# Patient Record
Sex: Female | Born: 2000 | Race: Black or African American | Hispanic: No | Marital: Single | Smoking: Never smoker
Health system: Southern US, Community
[De-identification: ages and names within clinical notes are randomized; demographics above are authoritative.]

## PROBLEM LIST (undated history)

## (undated) DIAGNOSIS — Z789 Other specified health status: Secondary | ICD-10-CM

## (undated) HISTORY — DX: Other specified health status: Z78.9

## (undated) HISTORY — PX: TONSILLECTOMY AND ADENOIDECTOMY: SUR1326

---
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
## 2018-04-26 ENCOUNTER — Ambulatory Visit (INDEPENDENT_AMBULATORY_CARE_PROVIDER_SITE_OTHER): Payer: 59

## 2018-04-26 VITALS — BP 111/65 | HR 91 | Ht 64.0 in | Wt 153.4 lb

## 2018-04-26 DIAGNOSIS — Z01419 Encounter for gynecological examination (general) (routine) without abnormal findings: Secondary | ICD-10-CM

## 2018-04-26 DIAGNOSIS — Z0000 Encounter for general adult medical examination without abnormal findings: Secondary | ICD-10-CM

## 2018-04-26 DIAGNOSIS — Z30017 Encounter for initial prescription of implantable subdermal contraceptive: Secondary | ICD-10-CM | POA: Diagnosis not present

## 2018-04-26 NOTE — Progress Notes (Signed)
Patient ID: Madeline Reed, female   DOB: Oct 06, 2000, 18 y.o.   MRN: [id number]   Nexplanon Insertion Procedure:  Madeline N Verdejo requests insertion of Nexplanon for contraception method.  She understands the risks associated with insertion including pain, bleeding, infection, and paresthesias of the arm.  Patient also understands that Nexplanon can cause change in bleeding.  However, patient accepts and understands all these risks and desires to proceed.  Nexplanon inserted as below.  Informed consent signed.   Appropriate time out taken.  Patient's non-dominant left arm was identified prepped and draped in the usual sterile fashion. The area was marked ~8 cm from epicondyle and 3cm posterior to the sulcus between the biceps and tricep muscles. The area was prepped with alcohol swab and then injected with 46mL of 1% lidocaine.  The area was then prepped with betadine x 2 and allowed 60 seconds before being wiped away with sterile gauze. Nexplanon was removed from packaging and device was confirmed within needle by provider visualization.   The device was then inserted per manufacturers instruction without complications.  The device was then palpated in the patient's arm by patient and provider. The insertion area was hemostatic with pressure and insertion site covered with sterile gauze and then wrapped with pressure dressing.  The patient tolerated the procedure well and was given post procedure instructions.  Patient also instructed to abstain from or utilize condoms for sexual activity for the next 7 days.   Cherre Robins MSN, CNM 04/26/2018

## 2018-04-26 NOTE — Patient Instructions (Signed)
Etonogestrel implant  What is this medicine?  ETONOGESTREL (et oh noe JES trel) is a contraceptive (birth control) device. It is used to prevent pregnancy. It can be used for up to 3 years.  This medicine may be used for other purposes; ask your health care provider or pharmacist if you have questions.  COMMON BRAND NAME(S): Implanon, Nexplanon  What should I tell my health care provider before I take this medicine?  They need to know if you have any of these conditions:  -abnormal vaginal bleeding  -blood vessel disease or blood clots  -breast, cervical, endometrial, ovarian, liver, or uterine cancer  -diabetes  -gallbladder disease  -heart disease or recent heart attack  -high blood pressure  -high cholesterol or triglycerides  -kidney disease  -liver disease  -migraine headaches  -seizures  -stroke  -tobacco smoker  -an unusual or allergic reaction to etonogestrel, anesthetics or antiseptics, other medicines, foods, dyes, or preservatives  -pregnant or trying to get pregnant  -breast-feeding  How should I use this medicine?  This device is inserted just under the skin on the inner side of your upper arm by a health care professional.  Talk to your pediatrician regarding the use of this medicine in children. Special care may be needed.  Overdosage: If you think you have taken too much of this medicine contact a poison control center or emergency room at once.  NOTE: This medicine is only for you. Do not share this medicine with others.  What if I miss a dose?  This does not apply.  What may interact with this medicine?  Do not take this medicine with any of the following medications:  -amprenavir  -fosamprenavir  This medicine may also interact with the following medications:  -acitretin  -aprepitant  -armodafinil  -bexarotene  -bosentan  -carbamazepine  -certain medicines for fungal infections like fluconazole, ketoconazole, itraconazole and voriconazole  -certain medicines to treat hepatitis, HIV or  AIDS  -cyclosporine  -felbamate  -griseofulvin  -lamotrigine  -modafinil  -oxcarbazepine  -phenobarbital  -phenytoin  -primidone  -rifabutin  -rifampin  -rifapentine  -St. John's wort  -topiramate  This list may not describe all possible interactions. Give your health care provider a list of all the medicines, herbs, non-prescription drugs, or dietary supplements you use. Also tell them if you smoke, drink alcohol, or use illegal drugs. Some items may interact with your medicine.  What should I watch for while using this medicine?  This product does not protect you against HIV infection (AIDS) or other sexually transmitted diseases.  You should be able to feel the implant by pressing your fingertips over the skin where it was inserted. Contact your doctor if you cannot feel the implant, and use a non-hormonal birth control method (such as condoms) until your doctor confirms that the implant is in place. Contact your doctor if you think that the implant may have broken or become bent while in your arm.  You will receive a user card from your health care provider after the implant is inserted. The card is a record of the location of the implant in your upper arm and when it should be removed. Keep this card with your health records.  What side effects may I notice from receiving this medicine?  Side effects that you should report to your doctor or health care professional as soon as possible:  -allergic reactions like skin rash, itching or hives, swelling of the face, lips, or tongue  -breast lumps, breast tissue   changes, or discharge  -breathing problems  -changes in emotions or moods  -if you feel that the implant may have broken or bent while in your arm  -high blood pressure  -pain, irritation, swelling, or bruising at the insertion site  -scar at site of insertion  -signs of infection at the insertion site such as fever, and skin redness, pain or discharge  -signs and symptoms of a blood clot such as breathing  problems; changes in vision; chest pain; severe, sudden headache; pain, swelling, warmth in the leg; trouble speaking; sudden numbness or weakness of the face, arm or leg  -signs and symptoms of liver injury like dark yellow or brown urine; general ill feeling or flu-like symptoms; light-colored stools; loss of appetite; nausea; right upper belly pain; unusually weak or tired; yellowing of the eyes or skin  -unusual vaginal bleeding, discharge  Side effects that usually do not require medical attention (report to your doctor or health care professional if they continue or are bothersome):  -acne  -breast pain or tenderness  -headache  -irregular menstrual bleeding  -nausea  This list may not describe all possible side effects. Call your doctor for medical advice about side effects. You may report side effects to FDA at 1-800-FDA-1088.  Where should I keep my medicine?  This drug is given in a hospital or clinic and will not be stored at home.  NOTE: This sheet is a summary. It may not cover all possible information. If you have questions about this medicine, talk to your doctor, pharmacist, or health care provider.   2019 Elsevier/Gold Standard (2017-01-12 14:11:42)

## 2018-04-26 NOTE — Progress Notes (Signed)
Pt does want Nexplanon

## 2018-04-26 NOTE — Progress Notes (Signed)
History:  Ms. Madeline Reed is a 18 y.o. female who presents to clinic today for well woman exam and contraception initiation. Patient denies vaginal concerns and reports her LMP was Feb 5th and was normal-light lasting 3 days.  Patient does not have a PCP, but reports overall good health.  She denies recent sexual activity in the last 2 weeks and declines STD testing.  Patient desires Nexplanon implant and has no questions or concerns regarding this method. Patient endorses safety at home.  The following portions of the patient's history were reviewed and updated as appropriate: allergies, current medications, family history, past medical history, social history, past surgical history and problem list.  Review of Systems:  Review of Systems  Gastrointestinal: Negative for constipation and diarrhea.  Genitourinary: Negative for dysuria.  Neurological: Negative for headaches.  Psychiatric/Behavioral: Negative for depression.  All other systems reviewed and are negative.     Objective:  Physical Exam BP 111/65   Pulse 91   Ht 5\' 4"  (1.626 m)   Wt 153 lb 6.4 oz (69.6 kg)   LMP 04/13/2018 (Exact Date)   BMI 26.33 kg/m  Physical Exam Constitutional:      Appearance: Normal appearance.  HENT:     Head: Normocephalic and atraumatic.     Mouth/Throat:     Mouth: Mucous membranes are moist.     Pharynx: Oropharynx is clear.  Eyes:     Pupils: Pupils are equal, round, and reactive to light.  Neck:     Musculoskeletal: Normal range of motion.  Cardiovascular:     Rate and Rhythm: Normal rate and regular rhythm.     Pulses: Normal pulses.     Heart sounds: Normal heart sounds.  Pulmonary:     Effort: Pulmonary effort is normal.     Breath sounds: Normal breath sounds.  Abdominal:     General: Abdomen is flat. Bowel sounds are normal.  Genitourinary:    Comments: Pelvic Exam Deferred Musculoskeletal: Normal range of motion.  Skin:    General: Skin is warm and dry.  Neurological:      Mental Status: She is alert and oriented to person, place, and time.  Psychiatric:        Mood and Affect: Mood normal.        Behavior: Behavior normal.        Thought Content: Thought content normal.      Labs and Imaging No results found for this or any previous visit (from the past 24 hour(s)).  No results found.   Assessment & Plan:  1. Encounter for initial prescription of Nexplanon -Reviewed risks and benefits of Nexplanon. -No questions or concerns. -Nexplanon inserted; See procedure note for further details.   2. Encounter for well woman exam without gynecological exam -Discussed obtaining PCP -Encouraged healthy sexual behaviors including condom usage. -Offered and declines STD Testing. -Discussed pelvic exam with pap smear when 18 years old. -Encouraged to RTC prn for issues or concerns.    Gerrit Heck, CNM 04/26/2018 1:48 PM

## 2018-04-27 ENCOUNTER — Encounter: Payer: Self-pay | Admitting: Obstetrics & Gynecology

## 2018-04-27 LAB — POCT PREGNANCY, URINE: Preg Test, Ur: NEGATIVE

## 2018-04-27 MED ORDER — ETONOGESTREL 68 MG ~~LOC~~ IMPL
68.0000 mg | DRUG_IMPLANT | Freq: Once | SUBCUTANEOUS | Status: AC
  Administered 2018-04-26: 68 mg via SUBCUTANEOUS

## 2018-04-27 NOTE — Addendum Note (Signed)
Addended by: Henrietta Dine on: 04/27/2018 09:56 AM   Modules accepted: Orders

## 2018-06-02 ENCOUNTER — Telehealth: Payer: Self-pay | Admitting: *Deleted

## 2018-06-02 NOTE — Telephone Encounter (Signed)
Robinette's Mom called and left a message she recently got nexplanon and had period start 2 weeks ago and still bleeding. Wants to know if this is normal.

## 2018-06-03 NOTE — Telephone Encounter (Signed)
Called pt and patient put her mom back on the phone, mom stated that she had been bleeding more like dark spotting and that it was tapering off but was keeping a log on it advised pt of the side effects of Nexplanon and to keep a log on how heavy her periods are and to call if they are constant and not letting up.

## 2018-07-11 ENCOUNTER — Telehealth (INDEPENDENT_AMBULATORY_CARE_PROVIDER_SITE_OTHER): Payer: 59 | Admitting: Family Medicine

## 2018-07-11 DIAGNOSIS — Z3046 Encounter for surveillance of implantable subdermal contraceptive: Secondary | ICD-10-CM

## 2018-07-11 NOTE — Telephone Encounter (Signed)
Returned pt's call regarding bleeding.  Pt stated she did not have a period for February and most of march but towards the end of march she started bleeding.  Pt states that she changes her pad 4x a day.  Pt gave mother her phone to join the conversation.  Advised pt that it takes time after she receives a new birth control for her hormones to become regulated and for her to have more regular bleeding.  Advised her that some people stop having periods on nexplanon, others have normal periods, and some have irregular bleeding like she is having throughout this last month.  Advised pt to wait another month or so to see if her body regulates itself and if not, then she could schedule an appointment to speak with a provider.  Advised pt that if she started passing clots golf ball size or larger, or was saturating a pad an hour for several hours, then she needed to contact the office.  Pt and mother verbalized understanding.  Pt's mother requested genetic testing for the pt because pt's mother reports that she (the mother) just tested positive for BRCA-2. Pt's mother request that pt be tested for BRCA.  Advised pt and her mother that I would have to review the case with a provider before an appointment could be made. They verbalized understanding.   Reviewed with Dr. Stinson.  Ok for pt to have [facility] drawn for BRCA testing. Will send message to front office.  

## 2018-07-11 NOTE — Telephone Encounter (Signed)
The patient stated she has been bleeding for a month due to received the nexplannon. She also stated she changes pad about 4x a day. Also has questions about genetic testing. Informed the patient I will send a message to the nurse.

## 2018-07-20 ENCOUNTER — Telehealth: Payer: Self-pay | Admitting: Obstetrics & Gynecology

## 2018-07-20 ENCOUNTER — Telehealth: Payer: Self-pay | Admitting: Lactation Services

## 2018-07-20 NOTE — Telephone Encounter (Signed)
Attempted to call patient about making a lab appointment. Left a VM message for her to call us.

## 2018-07-20 NOTE — Telephone Encounter (Signed)
Pt was returning call from front office. She requested that if they are unable to reach her by her phone # [phone] to please call her mother's phone @ [phone]

## 2018-07-25 ENCOUNTER — Telehealth: Payer: Self-pay

## 2018-07-25 ENCOUNTER — Other Ambulatory Visit: Payer: Self-pay

## 2018-07-25 ENCOUNTER — Telehealth: Payer: Self-pay | Admitting: Obstetrics & Gynecology

## 2018-07-25 DIAGNOSIS — Z1379 Encounter for other screening for genetic and chromosomal anomalies: Secondary | ICD-10-CM

## 2018-07-25 NOTE — Telephone Encounter (Signed)
Attempted to reach patient about coming in to get blood work. Was not able to reach her on the number in Epic. However, I was able to speak with her mother. She will be able to come in around 1:30 for this appointment.

## 2018-07-25 NOTE — Progress Notes (Unsigned)
Pt presents for cancer screening.  Mother tested positive for BRCA-2 gene. Mother's name is Kima Malenfant,  MRN: [id number]  [facility] # [id number]

## 2018-08-10 ENCOUNTER — Telehealth: Payer: Self-pay | Admitting: Emergency Medicine

## 2018-08-10 NOTE — Telephone Encounter (Signed)
Pt mother called and left a message on the nurse voicemail line stating the pt was having acne related to her birth control and she would like to have an antibiotic prescribed. Pt mother also states that they would like to know their test results.  Phone call was returned to pt and she gave permission for me to speak with her mother. Mother was informed that while acne can be a possible side of effect of the nexplanon, it is not a common side effect. Pt mother was encouraged to take daughter to see a dermatologist. Pt mother was also reassured that we would call her with the test results from lab draw on 5/19. No further questions or concerns.

## 2018-08-11 ENCOUNTER — Other Ambulatory Visit: Payer: Self-pay

## 2018-08-22 ENCOUNTER — Encounter: Payer: Self-pay | Admitting: *Deleted

## 2018-08-23 ENCOUNTER — Telehealth: Payer: Self-pay

## 2018-08-23 NOTE — Telephone Encounter (Signed)
Received lab report from [place] with positive BRCA.  Dr. Hulan Fray recommended that the pt receives genetic counseling through [facility] and then an appt with [facility] after the genetic counseling appt.  Notified pt's mother the results and that I would be getting in touch with her about genetic counseling appt once I able to set up with [facility] due them providing the service.  Pt's mother asked about her 20 year old daughter if she needs to be tested.  I explained that I would ask [facility] that question when I set up the counseling appt.  Pt's mother stated understanding.

## 2018-08-26 NOTE — Telephone Encounter (Signed)
LM on pt's VM stating that she would need to call [phone] to schedule her own genetic counseling appt with [facility].  I also stated that I am sending a MyChart message with more information if she could please respond or call the office.

## 2018-09-06 NOTE — Telephone Encounter (Signed)
Spoke with pt's mother who informed that she received my message.  She stated that they have a phone interview scheduled for 09/08/18 @ 0900.  I informed pt and pt's mother that Dr. Hulan Fray would like to have her schedule an appt after the consultation.  I also informed pt's mother that she could also get her other daughter tested as well for free but just testing her for the BRCA testing.  Pt's mother was excited and stated that she wanted her to get tested.  I informed pt's mother that I would request the front office to give her a call to schedule both daughters appt.  Pt's mother stated understanding with no further questions.

## 2018-10-12 ENCOUNTER — Ambulatory Visit (INDEPENDENT_AMBULATORY_CARE_PROVIDER_SITE_OTHER): Payer: Self-pay | Admitting: Obstetrics & Gynecology

## 2018-10-12 ENCOUNTER — Other Ambulatory Visit: Payer: Self-pay

## 2018-10-12 DIAGNOSIS — Z1509 Genetic susceptibility to other malignant neoplasm: Secondary | ICD-10-CM

## 2018-10-12 DIAGNOSIS — Z1501 Genetic susceptibility to malignant neoplasm of breast: Secondary | ICD-10-CM

## 2018-10-12 NOTE — Progress Notes (Signed)
   Subjective:    Patient ID: Madeline Reed, female    DOB: Jul 20, 2000, 18 y.o.   MRN: [id number]  HPI 18 yo single G0 here today to discuss [facility] results-  + BRCA2. She had this drawn because her mom tested +. She has not spoken with the genetic counselor from [facility] as of yet. 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 She is also using condoms.    Objective:   Physical Exam Breathing, conversing, and ambulating normally Well nourished, well hydrated Black female, no apparent distress Breast exam normal bilaterally, no adenopathy       Assessment & Plan:  BRCA 2+ per [facility]- I will give her the phone # for genetic counselor for [facility] Discussed risks of cancers and screening recommendations SBE taught and encouraged

## 2019-03-20 NOTE — Telephone Encounter (Signed)
Opened in error

## 2019-05-10 ENCOUNTER — Other Ambulatory Visit: Payer: Self-pay

## 2019-05-10 ENCOUNTER — Ambulatory Visit (INDEPENDENT_AMBULATORY_CARE_PROVIDER_SITE_OTHER): Payer: 59

## 2019-05-10 VITALS — BP 112/63 | HR 75 | Wt 148.0 lb

## 2019-05-10 DIAGNOSIS — N644 Mastodynia: Secondary | ICD-10-CM

## 2019-05-10 DIAGNOSIS — Z1509 Genetic susceptibility to other malignant neoplasm: Secondary | ICD-10-CM

## 2019-05-10 DIAGNOSIS — Z1239 Encounter for other screening for malignant neoplasm of breast: Secondary | ICD-10-CM

## 2019-05-10 DIAGNOSIS — Z1501 Genetic susceptibility to malignant neoplasm of breast: Secondary | ICD-10-CM | POA: Diagnosis not present

## 2019-05-10 HISTORY — DX: Mastodynia: N64.4

## 2019-05-10 NOTE — Patient Instructions (Addendum)
Breast Self-Awareness Breast self-awareness means being familiar with how your breasts look and feel. It involves checking your breasts regularly and reporting any changes to your health care provider. Practicing breast self-awareness is important. Sometimes changes may not be harmful (are benign), but sometimes a change in your breasts can be a sign of a serious medical problem. It is important to learn how to do this procedure correctly so that you can catch problems early, when treatment is more likely to be successful. All women should practice breast self-awareness, including women who have had breast implants. What you need:  A mirror.  A well-lit room. How to do a breast self-exam A breast self-exam is one way to learn what is normal for your breasts and whether your breasts are changing. To do a breast self-exam: Look for changes  1. Remove all the clothing above your waist. 2. Stand in front of a mirror in a room with good lighting. 3. Put your hands on your hips. 4. Push your hands firmly downward. 5. Compare your breasts in the mirror. Look for differences between them (asymmetry), such as: ? Differences in shape. ? Differences in size. ? Puckers, dips, and bumps in one breast and not the other. 6. Look at each breast for changes in the skin, such as: ? Redness. ? Scaly areas. 7. Look for changes in your nipples, such as: ? Discharge. ? Bleeding. ? Dimpling. ? Redness. ? A change in position. Feel for changes Carefully feel your breasts for lumps and changes. It is best to do this while lying on your back on the floor, and again while sitting or standing in the tub or shower with soapy water on your skin. Feel each breast in the following way: 1. Place the arm on the side of the breast you are examining above your head. 2. Feel your breast with the other hand. 3. Start in the nipple area and make -inch (2 cm) overlapping circles to feel your breast. Use the pads of your  three middle fingers to do this. Apply light pressure, then medium pressure, then firm pressure. The light pressure will allow you to feel the tissue closest to the skin. The medium pressure will allow you to feel the tissue that is a little deeper. The firm pressure will allow you to feel the tissue close to the ribs. 4. Continue the overlapping circles, moving downward over the breast until you feel your ribs below your breast. 5. Move one finger-width toward the center of the body. Continue to use the -inch (2 cm) overlapping circles to feel your breast as you move slowly up toward your collarbone. 6. Continue the up-and-down exam using all three pressures until you reach your armpit.  Write down what you find Writing down what you find can help you remember what to discuss with your health care provider. Write down:  What is normal for each breast.  Any changes that you find in each breast, including: ? The kind of changes you find. ? Any pain or tenderness. ? Size and location of any lumps.  Where you are in your menstrual cycle, if you are still menstruating. General tips and recommendations  Examine your breasts every month.  If you are breastfeeding, the best time to examine your breasts is after a feeding or after using a breast pump.  If you menstruate, the best time to examine your breasts is 5-7 days after your period. Breasts are generally lumpier during menstrual periods, and it may  be more difficult to notice changes.  With time and practice, you will become more familiar with the variations in your breasts and more comfortable with the exam. Contact a health care provider if you:  See a change in the shape or size of your breasts or nipples.  See a change in the skin of your breast or nipples, such as a reddened or scaly area.  Have unusual discharge from your nipples.  Find a lump or thick area that was not there before.  Have pain in your breasts.  Have any  concerns related to your breast health. Summary  Breast self-awareness includes looking for physical changes in your breasts, as well as feeling for any changes within your breasts.  Breast self-awareness should be performed in front of a mirror in a well-lit room.  You should examine your breasts every month. If you menstruate, the best time to examine your breasts is 5-7 days after your menstrual period.  Let your health care provider know of any changes you notice in your breasts, including changes in size, changes on the skin, pain or tenderness, or unusual fluid from your nipples. This information is not intended to replace advice given to you by your health care provider. Make sure you discuss any questions you have with your health care provider. Document Revised: 10/12/2017 Document Reviewed: 10/12/2017 Elsevier Patient Education  2020 [employer].  Breast Tenderness Breast tenderness is a common problem for women of all ages, but may also occur in men. Breast tenderness may range from mild discomfort to severe pain. In women, the pain usually comes and goes with the menstrual cycle, but it can also be constant. Breast tenderness has many possible causes, including hormone changes, infections, and taking certain medicines. You may have tests, such as a mammogram or an ultrasound, to check for any unusual findings. Having breast tenderness usually does not mean that you have breast cancer. Follow these instructions at home: Managing pain and discomfort   If directed, put ice to the painful area. To do this: ? Put ice in a plastic bag. ? Place a towel between your skin and the bag. ? Leave the ice on for 20 minutes, 2-3 times a day.  Wear a supportive bra, especially during exercise. You may also want to wear a supportive bra while sleeping if your breasts are very tender. Medicines  Take over-the-counter and prescription medicines only as told by your health care provider. If  the cause of your pain is infection, you may be prescribed an antibiotic medicine.  If you were prescribed an antibiotic, take it as told by your health care provider. Do not stop taking the antibiotic even if you start to feel better. Eating and drinking  Your health care provider may recommend that you lessen the amount of fat in your diet. You can do this by: ? Limiting fried foods. ? Cooking foods using methods such as baking, boiling, grilling, and broiling.  Decrease the amount of caffeine in your diet. Instead, drink more water and choose caffeine-free drinks. General instructions   Keep a log of the days and times when your breasts are most tender.  Ask your health care provider how to do breast exams at home. This will help you notice if you have an unusual growth or lump.  Keep all follow-up visits as told by your health care provider. This is important. Contact a health care provider if:  Any part of your breast is hard, red, and hot  to the touch. This may be a sign of infection.  You are a woman and: ? Not breastfeeding and you have fluid, especially blood or pus, coming out of your nipples. ? Have a new or painful lump in your breast that remains after your menstrual period ends.  You have a fever.  Your pain does not improve or it gets worse.  Your pain is interfering with your daily activities. Summary  Breast tenderness may range from mild discomfort to severe pain.  Breast tenderness has many possible causes, including hormone changes, infections, and taking certain medicines.  It can be treated with ice, wearing a supportive bra, and medicines.  Make changes to your diet if told to by your health care provider. This information is not intended to replace advice given to you by your health care provider. Make sure you discuss any questions you have with your health care provider. Document Revised: 07/18/2018 Document Reviewed: 07/18/2018 Elsevier Patient  Education  [place].

## 2019-05-10 NOTE — Progress Notes (Signed)
   GYNECOLOGY PROBLEM OFFICE VISIT NOTE  History:  Madeline Reed is a 18 y.o. G0P0 who presents today for left side breast lump. She states she noticed it about 2 weeks ago and states it is not painful, but "uncomfortable when I lay on my left side."  Patient reports pain is 4-5/10 and she has not tried any interventions to improve pain.  Patient states pain is constant. Patient denies nipple discharge.  She endorses breast exams twice a week.    Patient expresses concern with breast lump due to her mother's recent diagnosis, treatment, and double mastectomy as well as having the BRCA gene.  However, mother is alive and well.    She reports her period was Feb 14th and was normal.    No past medical history on file.  Past Surgical History:  Procedure Laterality Date  . TONSILLECTOMY AND ADENOIDECTOMY     at age 1    The following portions of the patient's history were reviewed and updated as appropriate: allergies, current medications, past family history, past medical history, past social history, past surgical history and problem list.   Health Maintenance:  No pap on file d/t age.   Review of Systems:   Review of Systems  Constitutional: Negative for chills and fever.  Respiratory: Negative for cough and shortness of breath.   Cardiovascular: Negative for chest pain.  Gastrointestinal: Negative for constipation, diarrhea, nausea and vomiting.  Genitourinary: Negative for dysuria.   Breast ROS: positive for - new or changing breast lumps    Objective:  Vitals: Wt 148 lb (67.1 kg)   BMI 25.40 kg/m   Physical Exam: Physical Exam Constitutional:      Appearance: Normal appearance.  HENT:     Head: Normocephalic and atraumatic.  Eyes:     Conjunctiva/sclera: Conjunctivae normal.  Cardiovascular:     Rate and Rhythm: Normal rate and regular rhythm.     Heart sounds: Normal heart sounds.  Pulmonary:     Effort: Pulmonary effort is normal.     Breath sounds: Normal  breath sounds.  Chest:     Breasts:        Right: Tenderness (At 9'0'clock) present. No nipple discharge or skin change.        Left: Tenderness (At 3'o'clock) present. No nipple discharge or skin change.     Comments: Breast with fibrocystic-like tissue bilaterally, particularly to outside upper quadrants were tenderness is located.  No distinctive lumps or masses palpated.  Musculoskeletal:        General: Normal range of motion.     Cervical back: Normal range of motion.  Neurological:     Mental Status: She is alert.  Psychiatric:        Mood and Affect: Mood normal.        Thought Content: Thought content normal.      Labs and Imaging: No results found.  Assessment & Plan:  18 year old Female CBE Breast Tenderness-Bilaterally BRCA 2 Positive  -Reviewed exam findings. -Patient informed that provider did not palpate any distinctive lumps or masses, but more fibrocystic tissue which is a normal finding. -Patient informed that US would be ordered for follow up in 1-2 weeks.  -Discussed that provider will follow up as appropriate. -Patient to follow up in 6 months for Annual visit or prn for issues.    Emly, Jessica, CNM 05/10/2019 1:54 PM  

## 2019-05-25 ENCOUNTER — Other Ambulatory Visit: Payer: Self-pay

## 2019-05-25 ENCOUNTER — Ambulatory Visit
Admission: RE | Admit: 2019-05-25 | Discharge: 2019-05-25 | Disposition: A | Discharge status: Home / Self Care | Payer: 59 | Source: Ambulatory Visit

## 2019-05-25 DIAGNOSIS — Z1239 Encounter for other screening for malignant neoplasm of breast: Secondary | ICD-10-CM

## 2019-05-25 DIAGNOSIS — Z1501 Genetic susceptibility to malignant neoplasm of breast: Secondary | ICD-10-CM

## 2019-05-25 DIAGNOSIS — N644 Mastodynia: Secondary | ICD-10-CM

## 2019-05-25 DIAGNOSIS — Z1509 Genetic susceptibility to other malignant neoplasm: Secondary | ICD-10-CM

## 2020-05-29 ENCOUNTER — Ambulatory Visit (INDEPENDENT_AMBULATORY_CARE_PROVIDER_SITE_OTHER): Payer: 59 | Admitting: Certified Nurse Midwife

## 2020-05-29 ENCOUNTER — Encounter: Payer: Self-pay | Admitting: Certified Nurse Midwife

## 2020-05-29 ENCOUNTER — Other Ambulatory Visit (HOSPITAL_COMMUNITY)
Admission: RE | Admit: 2020-05-29 | Discharge: 2020-05-29 | Disposition: A | Discharge status: Home / Self Care | Payer: 59 | Source: Ambulatory Visit | Attending: Certified Nurse Midwife | Admitting: Certified Nurse Midwife

## 2020-05-29 ENCOUNTER — Other Ambulatory Visit: Payer: Self-pay

## 2020-05-29 VITALS — BP 107/75 | HR 80 | Wt 142.8 lb

## 2020-05-29 DIAGNOSIS — Z975 Presence of (intrauterine) contraceptive device: Secondary | ICD-10-CM

## 2020-05-29 DIAGNOSIS — N926 Irregular menstruation, unspecified: Secondary | ICD-10-CM

## 2020-05-29 DIAGNOSIS — Z113 Encounter for screening for infections with a predominantly sexual mode of transmission: Secondary | ICD-10-CM | POA: Insufficient documentation

## 2020-05-29 DIAGNOSIS — Z01419 Encounter for gynecological examination (general) (routine) without abnormal findings: Secondary | ICD-10-CM

## 2020-05-29 MED ORDER — NORGESTIMATE-ETH ESTRADIOL 0.25-35 MG-MCG PO TABS: 1.0000 | ORAL_TABLET | Freq: Every day | ORAL | 11 refills | Status: AC

## 2020-05-29 NOTE — Patient Instructions (Signed)
t

## 2020-05-29 NOTE — Progress Notes (Signed)
Patient is here for abnormal bleeding. She stated that she started her period on March 1st and while her cycles usually last for about 2-3 days this time she has been bleeding for weeks, which is unusual for her. She is still currently bleeding/spotting and also mentioned that she has a Nexplanon that was inserted back in 2020.   Zayan Delvecchio, CMA

## 2020-05-29 NOTE — Progress Notes (Signed)
GYNECOLOGY CLINIC ANNUAL PREVENTATIVE CARE ENCOUNTER NOTE  Subjective:   Madeline Reed is a 20 y.o. G0P0 female here for a routine annual gynecologic exam.  Current complaints: irregular uterine bleeding. She had a cycle for two months when she first had her Nexplanon implanted, then a regular cycle lasting 2-3 days. Her last cycle has lasted two weeks, with just spotting the last few days. Wants to know if this is abnormal or just a side effect of Nexplanon.  She has not been sexually active in "about a year". Denies abnormal discharge, pelvic pain, problems with intercourse or other gynecologic concerns.    Gynecologic History Patient's last menstrual period was 05/07/2020 (exact date). Contraception: Nexplanon Last Pap: N/A Last mammogram: N/A  Obstetric History OB History  No obstetric history on file.    Past Medical History:  Diagnosis Date  . Medical history non-contributory     Past Surgical History:  Procedure Laterality Date  . TONSILLECTOMY AND ADENOIDECTOMY     at age 28    Current Outpatient Medications on File Prior to Visit  Medication Sig Dispense Refill  . polyethylene glycol powder (GLYCOLAX/MIRALAX) powder Take 17 g by mouth daily. (Patient not taking: No sig reported) 500 g 1   No current facility-administered medications on file prior to visit.   No Known Allergies  Social History   Socioeconomic History  . Marital status: Single    Spouse name: Not on file  . Number of children: Not on file  . Years of education: Not on file  . Highest education level: Not on file  Occupational History  . Not on file  Tobacco Use  . Smoking status: Never Smoker  . Smokeless tobacco: Never Used  Vaping Use  . Vaping Use: Never used  Substance and Sexual Activity  . Alcohol use: Yes    Comment: occ  . Drug use: Never  . Sexual activity: Not Currently    Birth control/protection: Implant  Other Topics Concern  . Not on file  Social History Narrative  .  Not on file   Social Determinants of Health   Financial Resource Strain: Not on file  Food Insecurity: Not on file  Transportation Needs: Not on file  Physical Activity: Not on file  Stress: Not on file  Social Connections: Not on file  Intimate Partner Violence: Not on file    Family History  Problem Relation Age of Onset  . BRCA 1/2 Mother   . Breast cancer Mother 62       BRCA 2 +    The following portions of the patient's history were reviewed and updated as appropriate: allergies, current medications, past family history, past medical history, past social history, past surgical history and problem list.  Review of Systems Pertinent items noted in HPI and remainder of comprehensive ROS otherwise negative.   Objective:  BP 107/75   Pulse 80   Wt 142 lb 12.8 oz (64.8 kg)   LMP 05/07/2020 (Exact Date)   BMI 24.51 kg/m  CONSTITUTIONAL: Well-developed, well-nourished female in no acute distress.  HENT:  Normocephalic, atraumatic, External right and left ear normal. Oropharynx is clear and moist EYES: Conjunctivae and EOM are normal. Pupils are equal, round, and reactive to light. No scleral icterus.  NECK: Normal range of motion, supple, no masses.  Normal thyroid.  SKIN: Skin is warm and dry. No rash noted. Not diaphoretic. No erythema. No pallor. [place]: Alert and oriented to person, place, and time. Normal reflexes,  muscle tone coordination. No cranial nerve deficit noted. PSYCHIATRIC: Normal mood and affect. Normal behavior. Normal judgment and thought content. CARDIOVASCULAR: Normal heart rate noted, regular rhythm RESPIRATORY: Clear to auscultation bilaterally. Effort and breath sounds normal, no problems with respiration noted. BREASTS: Symmetric in size. No masses, skin changes, nipple drainage, or lymphadenopathy. ABDOMEN: Soft, normal bowel sounds, no distention noted.  No tenderness, rebound or guarding.  PELVIC: Normal appearing external genitalia; normal  appearing vaginal mucosa and cervix.  No abnormal discharge noted.  Pap smear obtained.  Normal uterine size, no other palpable masses, no uterine or adnexal tenderness. MUSCULOSKELETAL: Normal range of motion. No tenderness.  No cyanosis, clubbing, or edema.  2+ distal pulses.   Assessment & Plan:  Annual gynecologic examination with pap smear and STI testing - Will follow results of pap smear/STI testing and manage accordingly - Routine preventative health maintenance measures emphasized. Irregular uterine bleeding r/t Nexplanon - STI testing performed to check for infection - One month of COC prescribed, pt to follow up if bleeding does not resolve   Follow up PRN or in one year for annual gyn visit.  Gaylan Gerold, CNM, MSN, [place] Certified Nurse Midwife, [facility]

## 2020-05-30 LAB — CERVICOVAGINAL ANCILLARY ONLY
Bacterial Vaginitis (gardnerella): NEGATIVE
Candida Glabrata: NEGATIVE
Candida Vaginitis: NEGATIVE
Comment: NEGATIVE
Comment: NEGATIVE
Comment: NEGATIVE

## 2020-05-31 LAB — CYTOLOGY - PAP
Chlamydia: NEGATIVE
Comment: NEGATIVE
Comment: NEGATIVE
Comment: NORMAL
Diagnosis: NEGATIVE
Neisseria Gonorrhea: NEGATIVE
Trichomonas: NEGATIVE

## 2020-11-05 ENCOUNTER — Encounter: Payer: Self-pay | Admitting: Emergency Medicine

## 2020-11-05 ENCOUNTER — Other Ambulatory Visit: Payer: Self-pay

## 2020-11-05 ENCOUNTER — Ambulatory Visit
Admission: EM | Admit: 2020-11-05 | Discharge: 2020-11-05 | Disposition: A | Discharge status: Home / Self Care | Payer: 59 | Attending: Internal Medicine | Admitting: Internal Medicine

## 2020-11-05 DIAGNOSIS — B370 Candidal stomatitis: Secondary | ICD-10-CM

## 2020-11-05 DIAGNOSIS — J029 Acute pharyngitis, unspecified: Secondary | ICD-10-CM | POA: Diagnosis not present

## 2020-11-05 LAB — POCT RAPID STREP A (OFFICE): Rapid Strep A Screen: NEGATIVE

## 2020-11-05 MED ORDER — NYSTATIN 100000 UNIT/ML MT SUSP: 500000.0000 [IU] | Freq: Four times a day (QID) | OROMUCOSAL | 0 refills | Status: AC

## 2020-11-05 NOTE — ED Triage Notes (Signed)
Complains of sore throat for 2 days, woke this morning with white bumps on tongue per patient

## 2020-11-05 NOTE — ED Provider Notes (Signed)
[facility]    CSN: [id number] Arrival date & time: 11/05/20  1144      History   Chief Complaint Chief Complaint  Patient presents with   Sore Throat    HPI Madeline Reed is a 20 y.o. female who presents with mother due to pt has had a TS x 2 days, with low grade temp the first day, but that resolved and this am noticed white patches on her tongue. States her tongue feels like she got burned. Has mild rhinitis and cough. She denies use of inhaler and does not eat after anyone but mother. But mother is asymptomatic.     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      Home Medications    Prior to Admission medications   Medication Sig Start Date End Date Taking? Authorizing Provider  nystatin (MYCOSTATIN) 100000 UNIT/ML suspension Take 5 mLs (500,000 Units total) by mouth 4 (four) times daily for 7 days. To swish and gargle and spit 11/05/20 11/12/20 Yes Rodriguez-Southworth, Sunday Spillers, PA-C  norgestimate-ethinyl estradiol (ORTHO-CYCLEN) 0.25-35 MG-MCG tablet Take 1 tablet by mouth daily. 05/29/20   Gabriel Carina, CNM    Family History Family History  Problem Relation Age of Onset   BRCA 1/2 Mother    Breast cancer Mother 24       BRCA 2 +    Social History Social History   Tobacco Use   Smoking status: Never   Smokeless tobacco: Never  Vaping Use   Vaping Use: Never used  Substance Use Topics   Alcohol use: Yes    Comment: occ   Drug use: Never     Allergies   Patient has no known allergies.   Review of Systems Review of Systems  Constitutional:  Positive for  fever. Negative for appetite change, chills and fatigue.  HENT:  Positive for mouth sores, rhinorrhea and sore throat. Negative for congestion.   Respiratory:  Positive for cough.   Musculoskeletal:  Negative for myalgias.  Skin:  Negative for rash.  Neurological:  Negative for headaches.  Hematological:  Negative for adenopathy.    Physical Exam Triage Vital Signs ED Triage Vitals  Enc Vitals Group     BP 11/05/20 1210 103/73     Pulse Rate 11/05/20 1210 94     Resp 11/05/20 1210 18     Temp 11/05/20 1210 98.8 F (37.1 C)     Temp Source 11/05/20 1210 Oral     SpO2 11/05/20 1210 97 %     Weight --      Height --      Head Circumference --      Peak Flow --      Pain Score 11/05/20 1213 6     Pain Loc --      Pain Edu? --      Excl. in [place]? --    No data found.  Updated Vital Signs BP 103/73 (BP Location: Right Arm)   Pulse 94   Temp 98.8 F (37.1 C) (Oral)   Resp  18   SpO2 97%   Visual Acuity Right Eye Distance:   Left Eye Distance:   Bilateral Distance:    Right Eye Near:   Left Eye Near:    Bilateral Near:     Physical Exam Vitals and nursing note reviewed.  Constitutional:      General: She is not in acute distress.    Appearance: She is normal weight. She is not toxic-appearing.  HENT:     Right Ear: Tympanic membrane and ear canal normal.     Left Ear: Tympanic membrane and ear canal normal.     Mouth/Throat:     Mouth: Mucous membranes are moist.     Pharynx: Uvula midline. Posterior oropharyngeal erythema present.     Tonsils: 0 on the right. 0 on the left.     Comments: Has white patches on tongue Eyes:     Conjunctiva/sclera: Conjunctivae normal.  Cardiovascular:     Rate and Rhythm: Normal rate and regular rhythm.  Pulmonary:     Effort: Pulmonary effort is normal.     Breath sounds: Normal breath sounds.  Musculoskeletal:     Cervical back: Neck supple.  Lymphadenopathy:     Cervical: No cervical adenopathy.  Skin:    General: Skin  is warm and dry.     Findings: No rash.  Neurological:     Mental Status: She is alert and oriented to person, place, and time.  Psychiatric:        Mood and Affect: Mood normal.        Behavior: Behavior normal.     UC Treatments / Results  Labs (all labs ordered are listed, but only abnormal results are displayed) Labs Reviewed  POCT RAPID STREP A (OFFICE)  Rapid strep is neg.   EKG   Radiology No results found.  Procedures Procedures (including critical care time)  Medications Ordered in UC Medications - No data to display  Initial Impression / Assessment and Plan / UC Course  I have reviewed the triage vital signs and the nursing notes. Pertinent labs  results that were available during my care of the patient were reviewed by me and considered in my medical decision making (see chart for details). Pharyngitis and oral thrush I placed her on Nystatin oral swish as noted.   Final Clinical Impressions(s) / UC Diagnoses   Final diagnoses:  Acute pharyngitis, unspecified etiology  Oral candida   Discharge Instructions   None    ED Prescriptions     Medication Sig Dispense Auth. Provider   nystatin (MYCOSTATIN) 100000 UNIT/ML suspension Take 5 mLs (500,000 Units total) by mouth 4 (four) times daily for 7 days. To swish and gargle and spit 140 mL Rodriguez-Southworth, Sunday Spillers, PA-C      PDMP not reviewed this encounter.   Shelby Mattocks, PA-C 11/05/20 1257

## 2021-05-20 IMAGING — US US BREAST*R* LIMITED INC AXILLA
1 series · 2 of 2 positions shown · non-contrast
Comparison: None.

CLINICAL DATA: 18-year-old female presenting for evaluation of a
palpable lump in the lateral right breast, as well as bilateral
lateral breast tenderness for about 3 weeks. The patient has family
history of breast cancer in her mother recently diagnosed at age 47.
The patient and her mother have both tested positive for BRCA 2 gene
mutation.

EXAM:
ULTRASOUND OF THE BILATERAL BREASTS

[Series 1: us breast*right* limited inc axilla · 0.07mm/px · 2 of 2 slices shown]
[im 1/2]
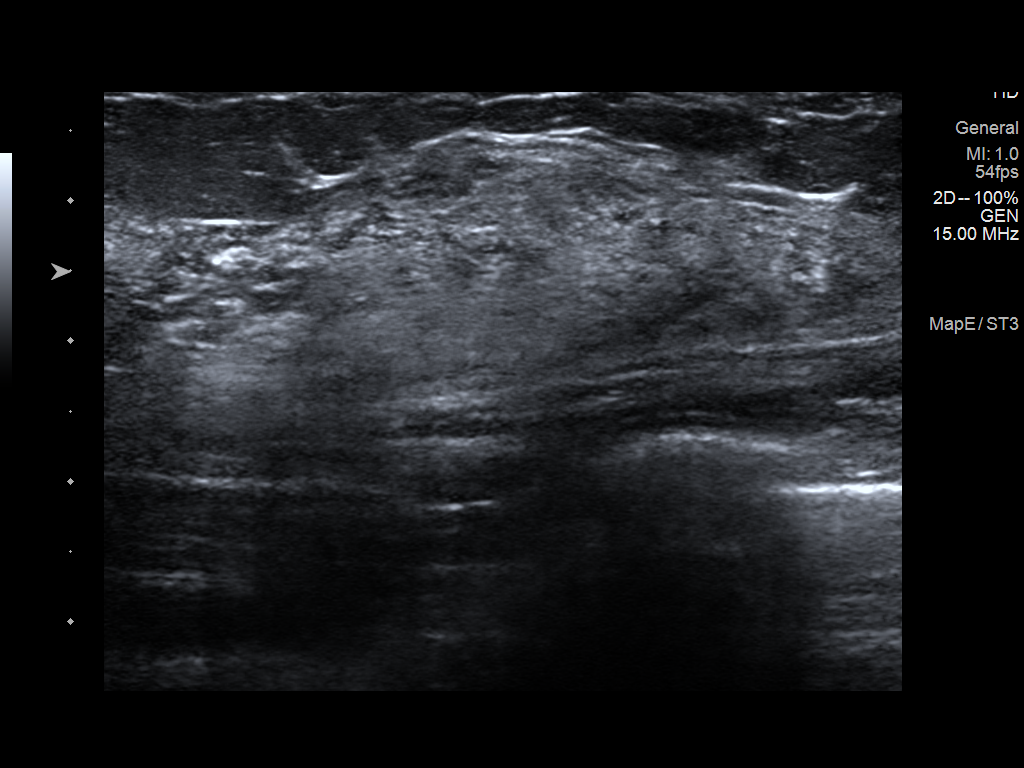
[im 2/2]
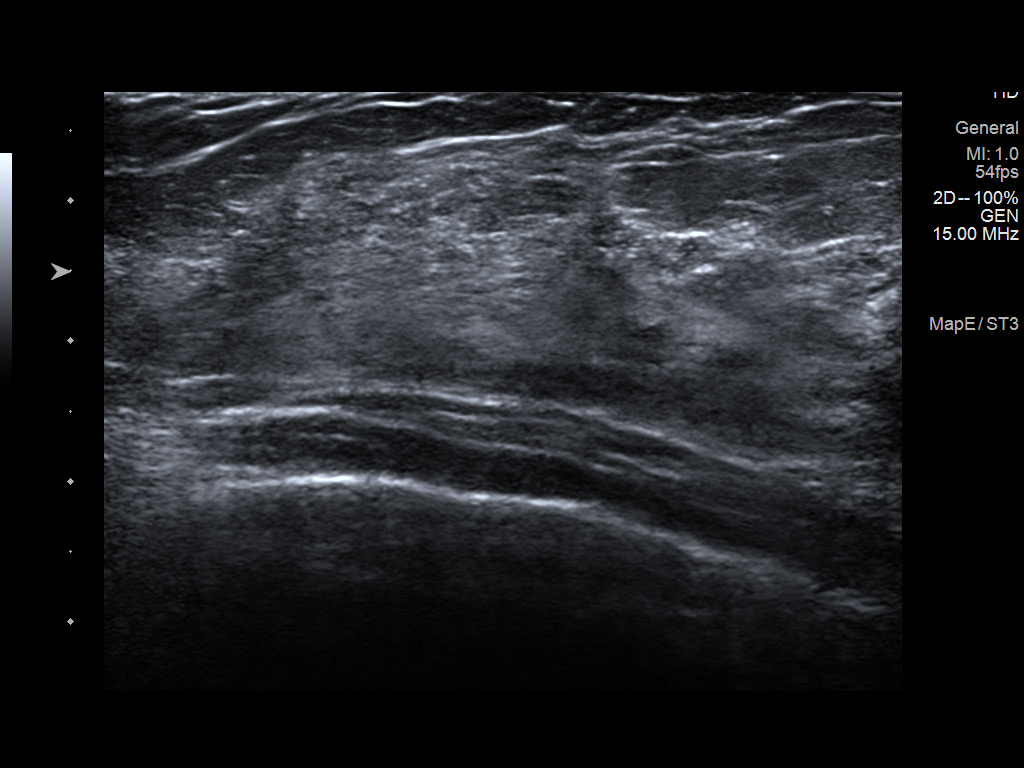

[2 of 2 positions shown; findings below may reference images not displayed]

FINDINGS: On physical exam, a firm ridge of tissue is palpated in the lateral
aspect of the right breast at the palpable site of concern. No
suspicious masses are palpated.

Targeted ultrasound is performed, showing normal fibroglandular
tissue in the region of pain in the right breast at 9 o'clock and in
the left breast at about 330. No suspicious masses or areas of
shadowing are identified.
IMPRESSION: 1. No targeted sonographic abnormalities in the bilateral breasts to
explain the patient's bilateral breast tenderness. No suspicious
findings in the right breast on targeted ultrasound to account for
the palpable site of concern.

RECOMMENDATION:
1. Clinical follow-up recommended for the palpable area of concern
in the lateral right breast and bilateral lateral breast pain. Any
further workup should be based on clinical grounds.

2. Given that the patient has tested positive for BRCA 2 gene
mutation, annual high risk screening MRI is recommended to start
between ages 25 and 30, in addition to annual mammography to start
at age 30.

I have discussed the findings and recommendations with the patient.
If applicable, a reminder letter will be sent to the patient
regarding the next appointment.

BI-RADS CATEGORY  1: Negative.

## 2021-05-21 ENCOUNTER — Encounter: Payer: Self-pay | Admitting: Family Medicine

## 2021-05-21 ENCOUNTER — Ambulatory Visit (INDEPENDENT_AMBULATORY_CARE_PROVIDER_SITE_OTHER): Payer: 59 | Admitting: Family Medicine

## 2021-05-21 ENCOUNTER — Other Ambulatory Visit: Payer: Self-pay

## 2021-05-21 VITALS — BP 121/87 | HR 97 | Wt 159.8 lb

## 2021-05-21 DIAGNOSIS — Z975 Presence of (intrauterine) contraceptive device: Secondary | ICD-10-CM

## 2021-05-21 DIAGNOSIS — Z30017 Encounter for initial prescription of implantable subdermal contraceptive: Secondary | ICD-10-CM

## 2021-05-21 DIAGNOSIS — Z3046 Encounter for surveillance of implantable subdermal contraceptive: Secondary | ICD-10-CM

## 2021-05-21 MED ORDER — ETONOGESTREL 68 MG ~~LOC~~ IMPL
68.0000 mg | DRUG_IMPLANT | Freq: Once | SUBCUTANEOUS | Status: AC
  Administered 2021-05-21: 68 mg via SUBCUTANEOUS

## 2021-05-21 NOTE — Progress Notes (Signed)
? ? ? ?  GYNECOLOGY OFFICE PROCEDURE NOTE ? ?[place] TIMA CURET is a 21 y.o. No obstetric history on file. here for Nexplanon removal and insertion.  Last pap smear was: ? ?Lab Results  ?Component Value Date  ? DIAGPAP  05/29/2020  ?  - Negative for intraepithelial lesion or malignancy (NILM)  ? ? ?Nexplanon removal and insertion Procedure ?Patient identified, informed consent performed, consent signed.   Patient does understand that irregular bleeding is a very common side effect of this medication. She was advised to have backup contraception for one week after replacement of the implant. Appropriate time out taken. Nexplanon site identified. Area prepped in usual sterile fashon. One ml of 1% lidocaine was used to anesthetize the area at the distal end of the implant in the left arm. A small stab incision was made right beside the implant on the distal portion. The Nexplanon rod was grasped using hemostats and removed without difficulty. There was minimal blood loss. There were no complications. Area was then injected with 3 ml of 1 % lidocaine. She was re-prepped with betadine, Nexplanon removed from packaging, Device confirmed in needle, then inserted full length of needle and withdrawn per handbook instructions. Nexplanon was able to palpated in the patient's arm; patient palpated the insert herself.  There was minimal blood loss. Patient insertion site covered with guaze and a pressure bandage to reduce any bruising. The patient tolerated the procedure well and was given post procedure instructions.  She was advised to have backup contraception for one week.   ? ?Venora Maples, MD/MPH ?Family Medicine, Faculty Practice ?[facility] [employer], [facility] ? ?

## 2021-05-21 NOTE — Progress Notes (Signed)
Patient would like her Nexplanon removed and reinserted today  ?

## 2021-05-22 ENCOUNTER — Ambulatory Visit: Payer: 59 | Admitting: Family Medicine

## 2022-10-04 ENCOUNTER — Ambulatory Visit (HOSPITAL_COMMUNITY): Payer: Self-pay

## 2022-10-05 ENCOUNTER — Ambulatory Visit (HOSPITAL_COMMUNITY): Payer: Self-pay

## 2022-10-06 ENCOUNTER — Ambulatory Visit: Payer: Self-pay

## 2023-05-23 ENCOUNTER — Ambulatory Visit
Admission: EM | Admit: 2023-05-23 | Discharge: 2023-05-23 | Disposition: A | Discharge status: Home / Self Care | Attending: Emergency Medicine | Admitting: Emergency Medicine

## 2023-05-23 DIAGNOSIS — B9689 Other specified bacterial agents as the cause of diseases classified elsewhere: Secondary | ICD-10-CM | POA: Diagnosis not present

## 2023-05-23 DIAGNOSIS — H109 Unspecified conjunctivitis: Secondary | ICD-10-CM | POA: Diagnosis not present

## 2023-05-23 MED ORDER — MOXIFLOXACIN HCL 0.5 % OP SOLN: 1.0000 [drp] | Freq: Three times a day (TID) | OPHTHALMIC | 0 refills | Status: AC

## 2023-05-23 NOTE — ED Provider Notes (Signed)
 [facility]    CSN: [id number] Arrival date & time: 05/23/23  1244      History   Chief Complaint Chief Complaint  Patient presents with   Conjunctivitis    HPI Madeline Reed is a 23 y.o. female.   Patient presents for evaluation of bilateral eye erythema, drainage with crusting and a burning sensation present for 2 days.  Denies injury or trauma, vision disturbance or light sensitivity.  Has not attempted treatment.  No known sick contacts.  Uses contacts but has not used recently.  Has eyelash extensions, last completed 3 weeks ago.  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      Home Medications    Prior to Admission medications   Medication Sig Start Date End Date Taking? Authorizing Provider  moxifloxacin (VIGAMOX) 0.5 % ophthalmic solution Place 1 drop into both eyes 3 (three) times daily. 05/23/23  Yes Yarixa Lightcap, Elita Boone, NP  norgestimate-ethinyl estradiol (ORTHO-CYCLEN) 0.25-35 MG-MCG tablet Take 1 tablet by mouth daily. Patient not taking: Reported on 05/21/2021 05/29/20   Bernerd Limbo, CNM    Family History Family History  Problem Relation Age of Onset   BRCA 1/2 Mother    Breast cancer Mother 6       BRCA 2 +    Social History Social History   Tobacco Use   Smoking status: Never   Smokeless tobacco: Never  Vaping Use   Vaping status: Never Used  Substance Use Topics   Alcohol use: Yes    Comment: occ   Drug use: Never     Allergies   Patient has no known allergies.   Review of Systems Review of Systems   Physical Exam Triage Vital Signs ED  Triage Vitals  Encounter Vitals Group     BP 05/23/23 1251 113/78     Systolic BP Percentile --      Diastolic BP Percentile --      Pulse Rate 05/23/23 1251 87     Resp 05/23/23 1251 15     Temp 05/23/23 1251 98.3 F (36.8 C)     Temp Source 05/23/23 1251 Oral     SpO2 05/23/23 1251 98 %     Weight --      Height --      Head Circumference --      Peak Flow --      Pain Score 05/23/23 1250 5     Pain Loc --      Pain Education --      Exclude from Growth Chart --    No data found.  Updated Vital Signs BP 113/78 (BP Location: Left Arm)   Pulse 87   Temp 98.3 F (36.8 C) (Oral)   Resp 15   SpO2 98%   Visual Acuity Right Eye Distance:   Left Eye Distance:   Bilateral Distance:    Right Eye Near:   Left Eye Near:    Bilateral Near:     Physical Exam Constitutional:      Appearance: Normal appearance.  Eyes:     Comments: Edema present to the bilateral conjunctiva, yellow drainage present to the bilateral upper lashes,, vision grossly intact, extraocular movements intact  Pulmonary:     Effort: Pulmonary effort is normal.  Neurological:     Mental Status: She is alert and oriented to person, place, and time.      UC Treatments / Results  Labs (all labs ordered are listed, but only abnormal results are displayed) Labs Reviewed - No data to display  EKG   Radiology No results found.  Procedures Procedures (including critical care time)  Medications Ordered in UC Medications - No data to display  Initial Impression / Assessment and Plan / UC Course  I have reviewed the triage vital signs and the nursing notes.  Pertinent labs & imaging results that were available during my care of the patient were reviewed by me and considered in my medical decision making (see chart for details).  \Bacterial conjunctivitis of both eyes  Presentation and symptomology consistent with above diagnosis, discussed with patient, prescribed moxifloxacin and recommended  additional supportive care, advised against use of contacts and that if reoccurrence of infection happens then recommended removal of lash extensions, may follow-up with urgent care as needed Final Clinical Impressions(s) / UC Diagnoses   Final diagnoses:  Bacterial conjunctivitis of both eyes     Discharge Instructions      Today you being treated for bacterial conjunctivitis.   Place one drop of moxifloxacin into the effected eye every 8 hours while awake for 7 days. If the other eye starts to have symptoms you may use medication in it as well. Do not allow tip of dropper to touch eye.  May use cool compress for comfort and to remove discharge if present. Pat the eye, do not wipe.  If wearing contacts, dispose of current pair. Wear glasses until symptoms have resolved.   Do not rub eyes, this may cause more irritation.  May use benadryl as needed to help if itching present.  Please avoid use of eye makeup until symptoms clear.  If symptoms persist after use of medication, please follow up at Urgent Care or with ophthalmologist (eye doctor)    ED Prescriptions     Medication Sig Dispense Auth. Provider   moxifloxacin (VIGAMOX) 0.5 % ophthalmic solution Place 1 drop into both eyes 3 (three) times daily. 3 mL Valinda Hoar, NP      PDMP not reviewed this encounter.   Valinda Hoar, NP 05/23/23 [phone]

## 2023-05-23 NOTE — Discharge Instructions (Signed)
 Today you being treated for bacterial conjunctivitis.   Place one drop of moxifloxacin into the effected eye every 8 hours while awake for 7 days. If the other eye starts to have symptoms you may use medication in it as well. Do not allow tip of dropper to touch eye.  May use cool compress for comfort and to remove discharge if present. Pat the eye, do not wipe.  If wearing contacts, dispose of current pair. Wear glasses until symptoms have resolved.   Do not rub eyes, this may cause more irritation.  May use benadryl as needed to help if itching present.  Please avoid use of eye makeup until symptoms clear.  If symptoms persist after use of medication, please follow up at Urgent Care or with ophthalmologist (eye doctor)

## 2023-05-23 NOTE — ED Triage Notes (Signed)
 Patient states that here right eye started hurting Friday night, woke up sat with discharge and crusty.

## 2023-05-24 ENCOUNTER — Ambulatory Visit: Payer: Self-pay

## 2023-06-10 ENCOUNTER — Other Ambulatory Visit: Payer: Self-pay | Admitting: Obstetrics and Gynecology

## 2023-06-10 DIAGNOSIS — N63 Unspecified lump in unspecified breast: Secondary | ICD-10-CM

## 2023-06-24 ENCOUNTER — Ambulatory Visit
Admission: RE | Admit: 2023-06-24 | Discharge: 2023-06-24 | Disposition: A | Discharge status: Home / Self Care | Source: Ambulatory Visit | Attending: Obstetrics and Gynecology | Admitting: Obstetrics and Gynecology

## 2023-06-24 DIAGNOSIS — N63 Unspecified lump in unspecified breast: Secondary | ICD-10-CM

## 2023-07-15 ENCOUNTER — Ambulatory Visit: Admitting: Advanced Practice Midwife

## 2024-01-05 ENCOUNTER — Other Ambulatory Visit: Payer: Self-pay | Admitting: Obstetrics and Gynecology

## 2024-01-05 DIAGNOSIS — N63 Unspecified lump in unspecified breast: Secondary | ICD-10-CM

## 2024-01-07 ENCOUNTER — Encounter: Payer: Self-pay | Admitting: Obstetrics and Gynecology

## 2024-01-10 ENCOUNTER — Ambulatory Visit
Admission: RE | Admit: 2024-01-10 | Discharge: 2024-01-10 | Disposition: A | Discharge status: Home / Self Care | Source: Ambulatory Visit | Attending: Obstetrics and Gynecology | Admitting: Obstetrics and Gynecology

## 2024-01-10 DIAGNOSIS — N63 Unspecified lump in unspecified breast: Secondary | ICD-10-CM

## 2024-01-10 MED ORDER — GADOPICLENOL 0.5 MMOL/ML IV SOLN
7.0000 mL | Freq: Once | INTRAVENOUS | Status: AC | PRN
  Administered 2024-01-10: 7 mL via INTRAVENOUS

## 2024-01-12 ENCOUNTER — Other Ambulatory Visit: Payer: Self-pay | Admitting: Obstetrics and Gynecology

## 2024-01-12 DIAGNOSIS — R928 Other abnormal and inconclusive findings on diagnostic imaging of breast: Secondary | ICD-10-CM

## 2024-01-24 ENCOUNTER — Ambulatory Visit
Admission: RE | Admit: 2024-01-24 | Discharge: 2024-01-24 | Disposition: A | Source: Ambulatory Visit | Attending: Obstetrics and Gynecology | Admitting: Obstetrics and Gynecology

## 2024-01-24 ENCOUNTER — Ambulatory Visit
Admission: RE | Admit: 2024-01-24 | Discharge: 2024-01-24 | Disposition: A | Discharge status: Home / Self Care | Source: Ambulatory Visit | Attending: Obstetrics and Gynecology | Admitting: Obstetrics and Gynecology

## 2024-01-24 DIAGNOSIS — R928 Other abnormal and inconclusive findings on diagnostic imaging of breast: Secondary | ICD-10-CM

## 2024-01-24 MED ORDER — GADOPICLENOL 0.5 MMOL/ML IV SOLN
7.0000 mL | Freq: Once | INTRAVENOUS | Status: AC | PRN
  Administered 2024-01-24: 7 mL via INTRAVENOUS

## 2024-01-25 LAB — SURGICAL PATHOLOGY
